# Patient Record
Sex: Male | Born: 1960 | Race: White | Hispanic: No | Marital: Married | Smoking: Never smoker
Health system: Southern US, Community
[De-identification: ages and names within clinical notes are randomized; demographics above are authoritative.]

## PROBLEM LIST (undated history)

## (undated) HISTORY — PX: HERNIA REPAIR: SHX51

## (undated) HISTORY — PX: NASAL SINUS SURGERY: SHX719

## (undated) HISTORY — PX: TRIGGER FINGER RELEASE: SHX641

---

## 2008-11-16 ENCOUNTER — Emergency Department: Payer: Self-pay | Admitting: Emergency Medicine

## 2009-01-11 ENCOUNTER — Ambulatory Visit: Payer: Self-pay | Admitting: Family Medicine

## 2009-12-20 ENCOUNTER — Ambulatory Visit: Payer: Self-pay | Admitting: Family Medicine

## 2010-03-22 ENCOUNTER — Ambulatory Visit: Payer: Self-pay | Admitting: Internal Medicine

## 2010-11-23 ENCOUNTER — Ambulatory Visit: Payer: Self-pay | Admitting: Unknown Physician Specialty

## 2010-11-24 ENCOUNTER — Other Ambulatory Visit: Payer: Self-pay

## 2010-11-30 ENCOUNTER — Ambulatory Visit: Payer: Self-pay | Admitting: Unknown Physician Specialty

## 2014-08-15 ENCOUNTER — Other Ambulatory Visit: Payer: Self-pay | Admitting: Family Medicine

## 2014-08-15 ENCOUNTER — Ambulatory Visit
Admission: RE | Admit: 2014-08-15 | Discharge: 2014-08-15 | Disposition: A | Discharge status: Home / Self Care | Payer: No Typology Code available for payment source | Source: Ambulatory Visit | Attending: Family Medicine | Admitting: Family Medicine

## 2014-08-15 DIAGNOSIS — K760 Fatty (change of) liver, not elsewhere classified: Secondary | ICD-10-CM | POA: Insufficient documentation

## 2014-08-15 DIAGNOSIS — K4090 Unilateral inguinal hernia, without obstruction or gangrene, not specified as recurrent: Secondary | ICD-10-CM | POA: Diagnosis not present

## 2014-08-15 DIAGNOSIS — D72829 Elevated white blood cell count, unspecified: Secondary | ICD-10-CM

## 2014-08-15 DIAGNOSIS — Z9889 Other specified postprocedural states: Secondary | ICD-10-CM | POA: Insufficient documentation

## 2014-08-15 DIAGNOSIS — R1032 Left lower quadrant pain: Secondary | ICD-10-CM

## 2014-08-15 DIAGNOSIS — Z8719 Personal history of other diseases of the digestive system: Secondary | ICD-10-CM

## 2014-08-15 DIAGNOSIS — K5732 Diverticulitis of large intestine without perforation or abscess without bleeding: Secondary | ICD-10-CM | POA: Diagnosis not present

## 2017-08-22 ENCOUNTER — Encounter: Payer: Self-pay | Admitting: Emergency Medicine

## 2017-08-22 ENCOUNTER — Emergency Department: Payer: 59

## 2017-08-22 ENCOUNTER — Emergency Department
Admission: EM | Admit: 2017-08-22 | Discharge: 2017-08-23 | Disposition: A | Discharge status: Home / Self Care | Payer: 59 | Attending: Emergency Medicine | Admitting: Emergency Medicine

## 2017-08-22 ENCOUNTER — Other Ambulatory Visit: Payer: Self-pay

## 2017-08-22 DIAGNOSIS — R030 Elevated blood-pressure reading, without diagnosis of hypertension: Secondary | ICD-10-CM | POA: Diagnosis not present

## 2017-08-22 DIAGNOSIS — R0789 Other chest pain: Secondary | ICD-10-CM | POA: Diagnosis present

## 2017-08-22 DIAGNOSIS — R002 Palpitations: Secondary | ICD-10-CM | POA: Diagnosis not present

## 2017-08-22 LAB — BASIC METABOLIC PANEL
Anion gap: 7 (ref 5–15)
BUN: 20 mg/dL (ref 6–20)
CALCIUM: 9.4 mg/dL (ref 8.9–10.3)
CHLORIDE: 103 mmol/L (ref 98–111)
CO2: 28 mmol/L (ref 22–32)
CREATININE: 0.99 mg/dL (ref 0.61–1.24)
GFR calc Af Amer: >60 mL/min (ref >60)
GFR calc non Af Amer: >60 mL/min (ref >60)
Glucose, Bld: 114 mg/dL — ABNORMAL HIGH (ref 70–99)
Potassium: 3.7 mmol/L (ref 3.5–5.1)
SODIUM: 138 mmol/L (ref 135–145)

## 2017-08-22 LAB — CBC
HCT: 45.9 % (ref 40.0–52.0)
Hemoglobin: 15.8 g/dL (ref 13.0–18.0)
MCH: 29.1 pg (ref 26.0–34.0)
MCHC: 34.5 g/dL (ref 32.0–36.0)
MCV: 84.5 fL (ref 80.0–100.0)
PLATELETS: 193 10*3/uL (ref 150–440)
RBC: 5.43 MIL/uL (ref 4.40–5.90)
RDW: 14.3 % (ref 11.5–14.5)
WBC: 8.5 10*3/uL (ref 3.8–10.6)

## 2017-08-22 LAB — TROPONIN I: Troponin I: <0.03 ng/mL (ref <0.03)

## 2017-08-22 NOTE — ED Triage Notes (Signed)
Pt comes into the ED via POV c/o chest pain that started on Sunday and has progresses to palpitations and right shoulder blade pain.  Patient states he has had some vertigo but was contributing it to his allergies, but denies any N/V.  Patient has even and unlabored respirations at this time and in NAD.  Patient has no diaphoresis  At this time.

## 2017-08-22 NOTE — ED Provider Notes (Signed)
[facility] Emergency Department Provider Note  ____________________________________________   First MD Initiated Contact with Patient 08/22/17 2255     (approximate)  I have reviewed the triage vital signs and the nursing notes.   HISTORY  Chief Complaint Chest Pain    HPI Jared Jones is a 57 y.o. male who self presents to the emergency department with a 10-minute episode of atypical chest pain 5 days ago.  He was in the car driving to dinner when he felt substernal pressure-like chest pain nonradiating.  No nausea.  No shortness of breath.  No diaphoresis.  This self resolved after 10 minutes and has not returned.  He has had intermittent episodes of palpitations throughout the course of the week.  Is been under tremendous amount of stress at work.  He began taking a baby aspirin every day once the chest pain occurred.  He takes no medications at home.  No history of DVT or pulmonary embolism.  No leg swelling.  No hemoptysis.  No history of myocardial infarction.  The pain was not ripping or tearing did not go straight to his back.  He is in the emergency department tonight because he is going on vacation to [place] and would like to make sure that he is "healthy enough for the trip".  He has never been diagnosed with hypertension but says that at his last several primary care office appointments his blood pressure has been slightly up.  He does not smoke.  He does not drink caffeine.    History reviewed. No pertinent past medical history.  There are no active problems to display for this patient.   Past Surgical History:  Procedure Laterality Date  . HERNIA REPAIR     inguinal hernia  . NASAL SINUS SURGERY    . TRIGGER FINGER RELEASE      Prior to Admission medications   Medication Sig Start Date End Date Taking? Authorizing Provider  hydrochlorothiazide (HYDRODIURIL) 12.5 MG tablet Take 1 tablet (12.5 mg total) by mouth daily. 08/23/17    Merrily Brittle, MD    Allergies Codeine and Augmentin [amoxicillin-pot clavulanate]  No family history on file.  Social History Social History   Tobacco Use  . Smoking status: Never Smoker  . Smokeless tobacco: Never Used  Substance Use Topics  . Alcohol use: Yes  . Drug use: Not Currently    Review of Systems Constitutional: No fever/chills Eyes: No visual changes. ENT: No sore throat. Cardiovascular: Positive for chest pain. Respiratory: Denies shortness of breath. Gastrointestinal: No abdominal pain.  No nausea, no vomiting.  No diarrhea.  No constipation. Genitourinary: Negative for dysuria. Musculoskeletal: Negative for back pain. Skin: Negative for rash. Neurological: Negative for headaches, focal weakness or numbness.   ____________________________________________   PHYSICAL EXAM:  VITAL SIGNS: ED Triage Vitals  Enc Vitals Group     BP 08/22/17 2001 (!) 177/95     Pulse Rate 08/22/17 2001 69     Resp 08/22/17 2001 16     Temp 08/22/17 2001 98.5 F (36.9 C)     Temp Source 08/22/17 2001 Oral     SpO2 08/22/17 2001 99 %     Weight 08/22/17 1958 204 lb (92.5 kg)     Height 08/22/17 1958 5' 8.5" (1.74 m)     Head Circumference --      Peak Flow --      Pain Score 08/22/17 1958 1     Pain Loc --  Pain Edu? --      Excl. in GC? --     Constitutional: Alert and oriented x4 well-appearing nontoxic no diaphoresis speaks full clear sentences Eyes: PERRL EOMI. Head: Atraumatic. Nose: No congestion/rhinnorhea. Mouth/Throat: No trismus Neck: No stridor.   Cardiovascular: Normal rate, regular rhythm. Grossly normal heart sounds.  Good peripheral circulation. Respiratory: Normal respiratory effort.  No retractions. Lungs CTAB and moving good air Gastrointestinal: Soft nontender Musculoskeletal: No lower extremity edema legs are equal in size Neurologic:  Normal speech and language. No gross focal neurologic deficits are appreciated. Skin:  Skin is  warm, dry and intact. No rash noted. Psychiatric: Mood and affect are normal. Speech and behavior are normal.    ____________________________________________   DIFFERENTIAL includes but not limited to  Acute coronary syndrome, pulmonary embolism, atrial fibrillation, supraventricular tachycardia, premature ventricular contractions ____________________________________________   LABS (all labs ordered are listed, but only abnormal results are displayed)  Labs Reviewed  BASIC METABOLIC PANEL - Abnormal; Notable for the following components:      Result Value   Glucose, Bld 114 (*)    All other components within normal limits  CBC  TROPONIN I  TROPONIN I    Lab work reviewed by me with no signs of acute ischemia x2 __________________________________________  EKG  ED ECG REPORT I, Merrily BrittleNeil Roshanda Balazs, the attending physician, personally viewed and interpreted this ECG.  Date: 08/22/2017 EKG Time:  Rate: 67 Rhythm: normal sinus rhythm QRS Axis: normal Intervals: normal ST/T Wave abnormalities: normal Narrative Interpretation: no evidence of acute ischemia  ____________________________________________  RADIOLOGY  Chest x-ray reviewed by me with no acute disease ____________________________________________   PROCEDURES  Procedure(s) performed: no  Procedures  Critical Care performed: no  ____________________________________________   INITIAL IMPRESSION / ASSESSMENT AND PLAN / ED COURSE  Pertinent labs & imaging results that were available during my care of the patient were reviewed by me and considered in my medical decision making (see chart for details).   The patient arrives hemodynamically stable and well-appearing.  EKG is nonischemic and first troponin is negative.  To fully rule him out given his age and possible elevated blood pressure I do think he requires a second troponin.  Fortunately the patient's second troponin is negative.  I will prescribe him  low-dose hydrochlorothiazide to begin managing his elevated blood pressure and refer him back to primary care.  He is discharged home in improved condition verbalizes understanding and agreement with the plan.      ____________________________________________   FINAL CLINICAL IMPRESSION(S) / ED DIAGNOSES  Final diagnoses:  Atypical chest pain  Palpitations  Elevated blood pressure reading      NEW MEDICATIONS STARTED DURING THIS VISIT:  Discharge Medication List as of 08/23/2017 12:52 AM    START taking these medications   Details  hydrochlorothiazide (HYDRODIURIL) 12.5 MG tablet Take 1 tablet (12.5 mg total) by mouth daily., Starting Sat 08/23/2017, Print         Note:  This document was prepared using Dragon voice recognition software and may include unintentional dictation errors.     Merrily Brittleifenbark, Lorcan Shelp, MD 08/23/17 [phone]

## 2017-08-22 NOTE — ED Notes (Signed)
Patient reports intermittent palpitations beginning Sunday. Patient c/o left chest pain, radiating to left neck, right shoulder and back Sunday and again today. Patient reports diaphoresis and vertigo on Sunday

## 2017-08-23 LAB — TROPONIN I

## 2017-08-23 MED ORDER — HYDROCHLOROTHIAZIDE 12.5 MG PO TABS
12.5000 mg | ORAL_TABLET | Freq: Every day | ORAL | 0 refills | Status: AC
Start: 2017-08-23 — End: ?

## 2017-08-23 NOTE — ED Notes (Signed)
Reviewed discharge instructions, follow-up care, and prescriptions with patient. Patient verbalized understanding of all information reviewed. Patient stable, with no distress noted at this time.    

## 2017-08-23 NOTE — Discharge Instructions (Signed)
It was a pleasure to take care of you today, and thank you for coming to our emergency department.  If you have any questions or concerns before leaving please ask the nurse to grab me and I'm more than happy to go through your aftercare instructions again.  If you were prescribed any opioid pain medication today such as Norco, Vicodin, Percocet, morphine, hydrocodone, or oxycodone please make sure you do not drive when you are taking this medication as it can alter your ability to drive safely.  If you have any concerns once you are home that you are not improving or are in fact getting worse before you can make it to your follow-up appointment, please do not hesitate to call [phone] and come back for further evaluation.  Merrily BrittleNeil Victor Granados, MD  Results for orders placed or performed during the hospital encounter of 08/22/17  Basic metabolic panel  Result Value Ref Range   Sodium 138 135 - 145 mmol/L   Potassium 3.7 3.5 - 5.1 mmol/L   Chloride 103 98 - 111 mmol/L   CO2 28 22 - 32 mmol/L   Glucose, Bld 114 (H) 70 - 99 mg/dL   BUN 20 6 - 20 mg/dL   Creatinine, Ser 1.610.99 0.61 - 1.24 mg/dL   Calcium 9.4 8.9 - 09.610.3 mg/dL   GFR calc non Af Amer >60 >60 mL/min   GFR calc Af Amer >60 >60 mL/min   Anion gap 7 5 - 15  CBC  Result Value Ref Range   WBC 8.5 3.8 - 10.6 K/uL   RBC 5.43 4.40 - 5.90 MIL/uL   Hemoglobin 15.8 13.0 - 18.0 g/dL   HCT 04.545.9 40.940.0 - 81.152.0 %   MCV 84.5 80.0 - 100.0 fL   MCH 29.1 26.0 - 34.0 pg   MCHC 34.5 32.0 - 36.0 g/dL   RDW 91.414.3 78.211.5 - 95.614.5 %   Platelets 193 150 - 440 K/uL  Troponin I  Result Value Ref Range   Troponin I <0.03 <0.03 ng/mL  Troponin I  Result Value Ref Range   Troponin I <0.03 <0.03 ng/mL   Dg Chest 2 View  Result Date: 08/22/2017 CLINICAL DATA:  Chest pain since 08/18/2017. EXAM: CHEST - 2 VIEW COMPARISON:  PA and lateral chest 11/16/2008. FINDINGS: The lungs are clear. Heart size is normal. No pneumothorax or pleural effusion. No acute or focal bony  abnormality. Aortic atherosclerosis noted. IMPRESSION: No acute disease. Atherosclerosis. Electronically Signed   By: Drusilla Kannerhomas  Dalessio M.D.   On: 08/22/2017 20:25

## 2018-02-24 ENCOUNTER — Ambulatory Visit
Admission: RE | Admit: 2018-02-24 | Discharge: 2018-02-24 | Disposition: A | Discharge status: Home / Self Care | Payer: BLUE CROSS/BLUE SHIELD | Attending: Family Medicine | Admitting: Family Medicine

## 2018-02-24 ENCOUNTER — Ambulatory Visit
Admission: RE | Admit: 2018-02-24 | Discharge: 2018-02-24 | Disposition: A | Discharge status: Home / Self Care | Payer: BLUE CROSS/BLUE SHIELD | Source: Ambulatory Visit | Attending: Family Medicine | Admitting: Family Medicine

## 2018-02-24 ENCOUNTER — Other Ambulatory Visit: Payer: Self-pay | Admitting: Family Medicine

## 2018-02-24 DIAGNOSIS — R053 Chronic cough: Secondary | ICD-10-CM

## 2018-02-24 DIAGNOSIS — R05 Cough: Secondary | ICD-10-CM

## 2018-11-14 IMAGING — CR DG CHEST 2V
2 series · 2 of 2 positions shown · non-contrast
Comparison: PA and lateral chest 11/16/2008.

CLINICAL DATA: Chest pain since 08/18/2017.

EXAM:
CHEST - 2 VIEW

[chest pa]
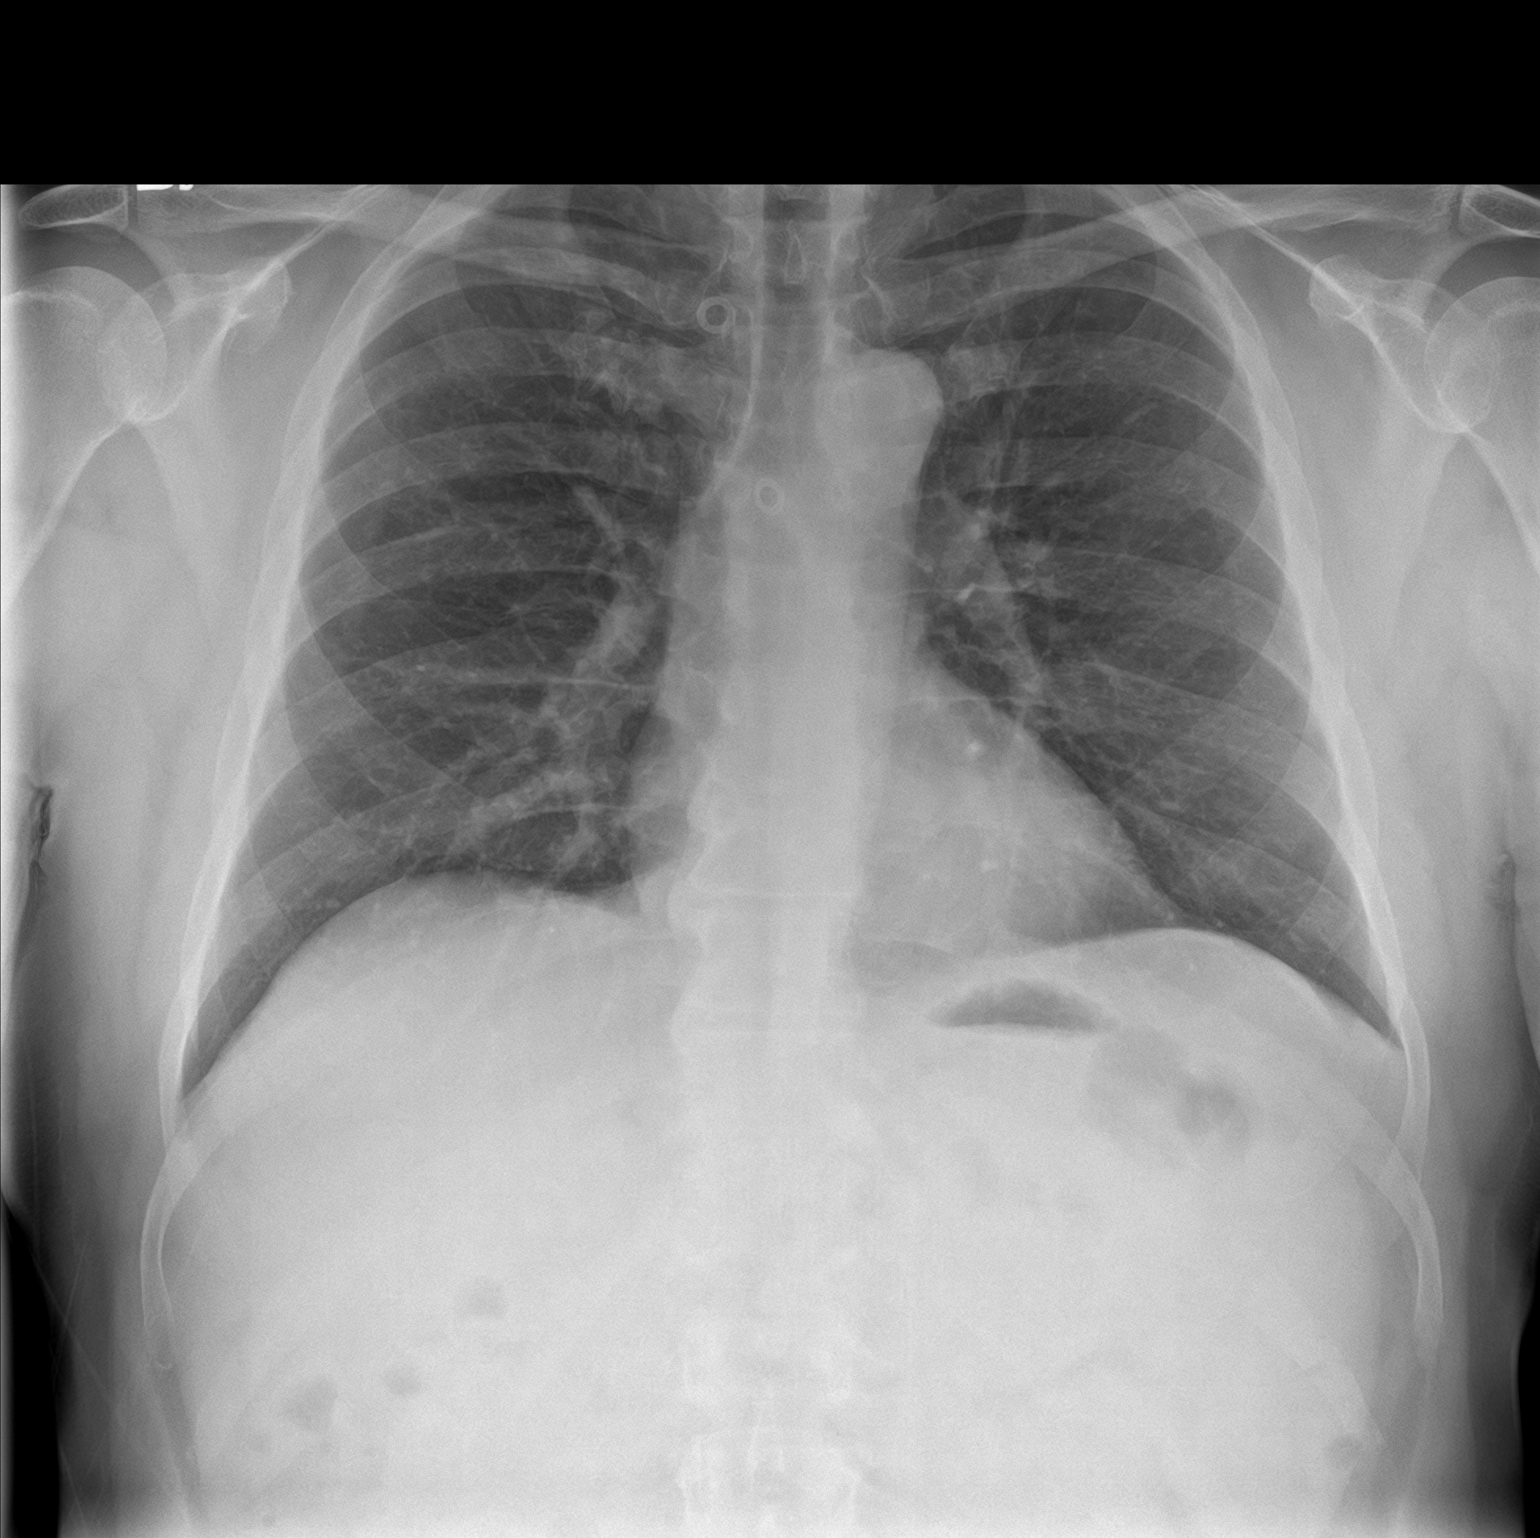

[chest lat]
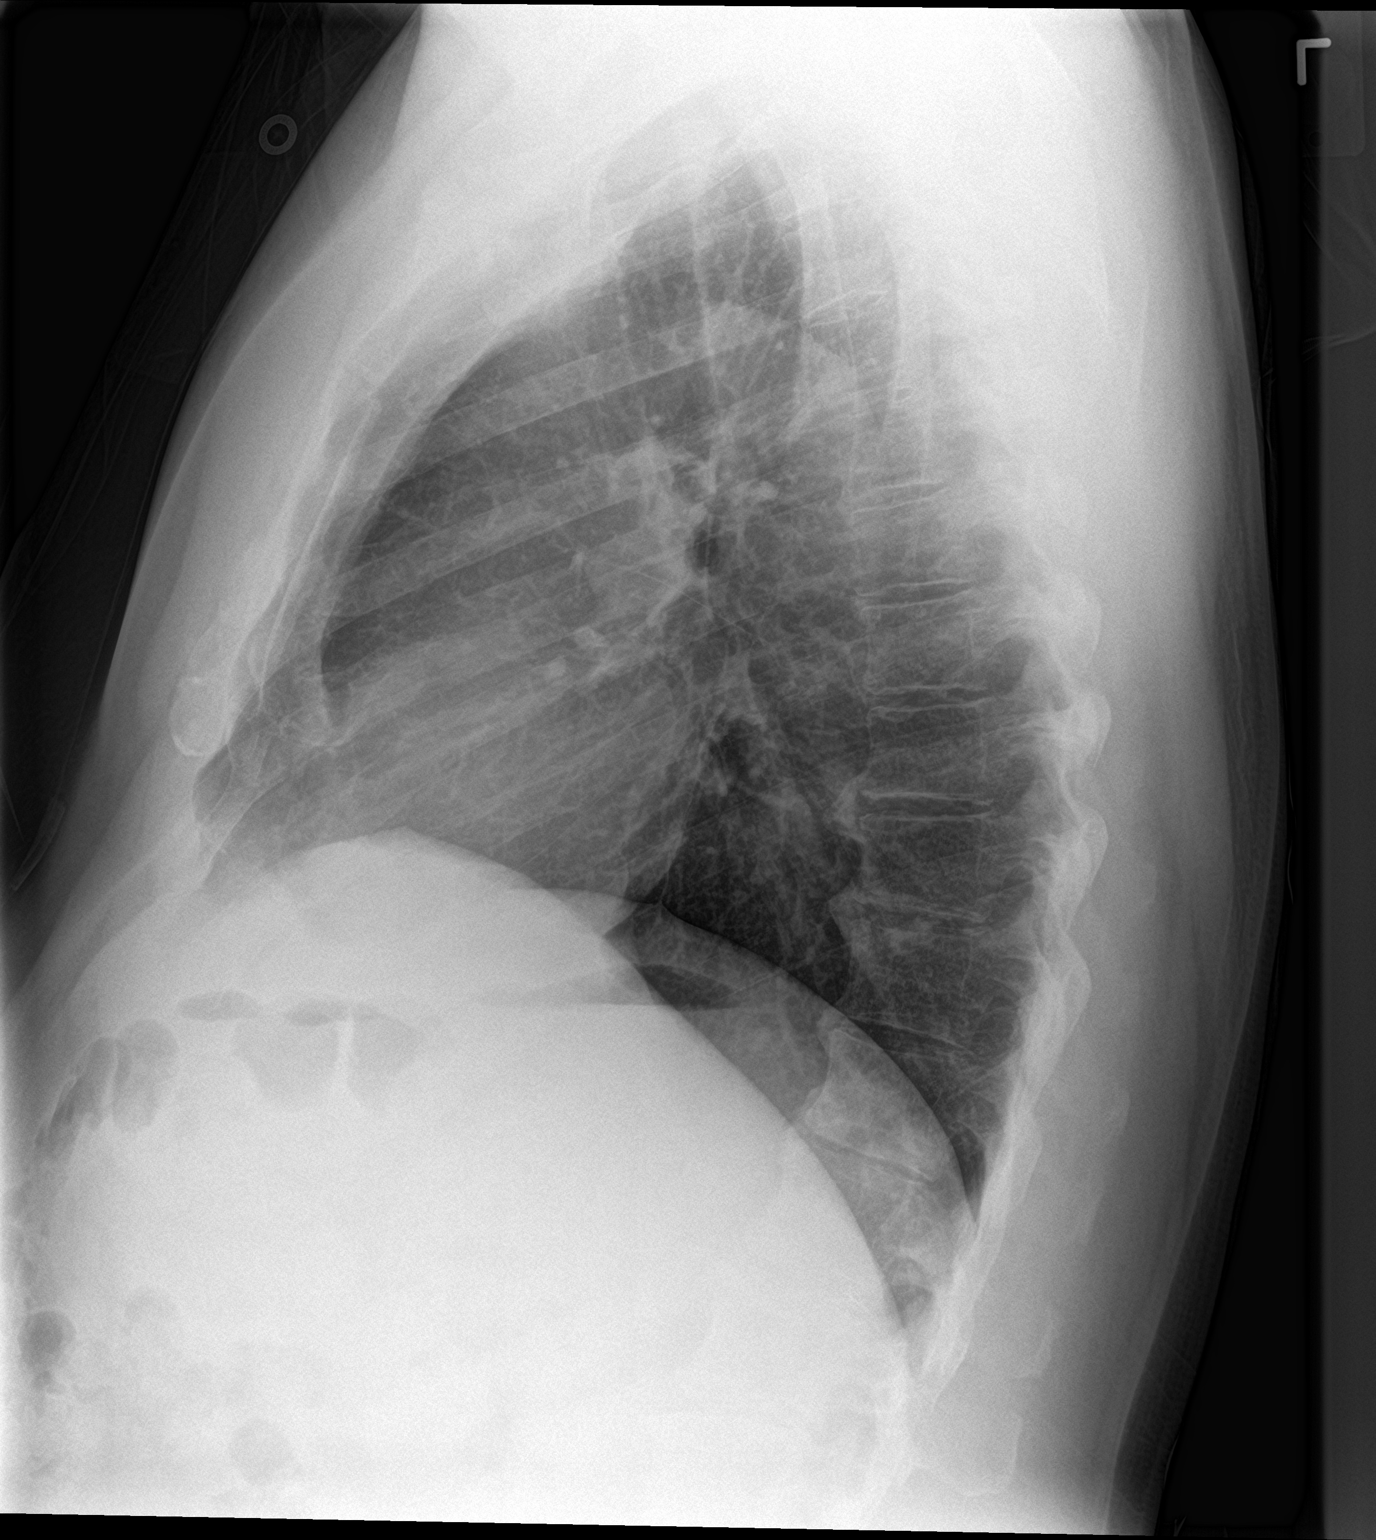

[2 of 2 positions shown; findings below may reference images not displayed]

FINDINGS: The lungs are clear. Heart size is normal. No pneumothorax or
pleural effusion. No acute or focal bony abnormality. Aortic
atherosclerosis noted.
IMPRESSION: No acute disease.

Atherosclerosis.

## 2019-05-19 IMAGING — CR DG CHEST 2V
2 series · 2 of 2 positions shown · non-contrast
Comparison: 08/22/2017

CLINICAL DATA: Productive cough.

EXAM:
CHEST - 2 VIEW

[chest pa]
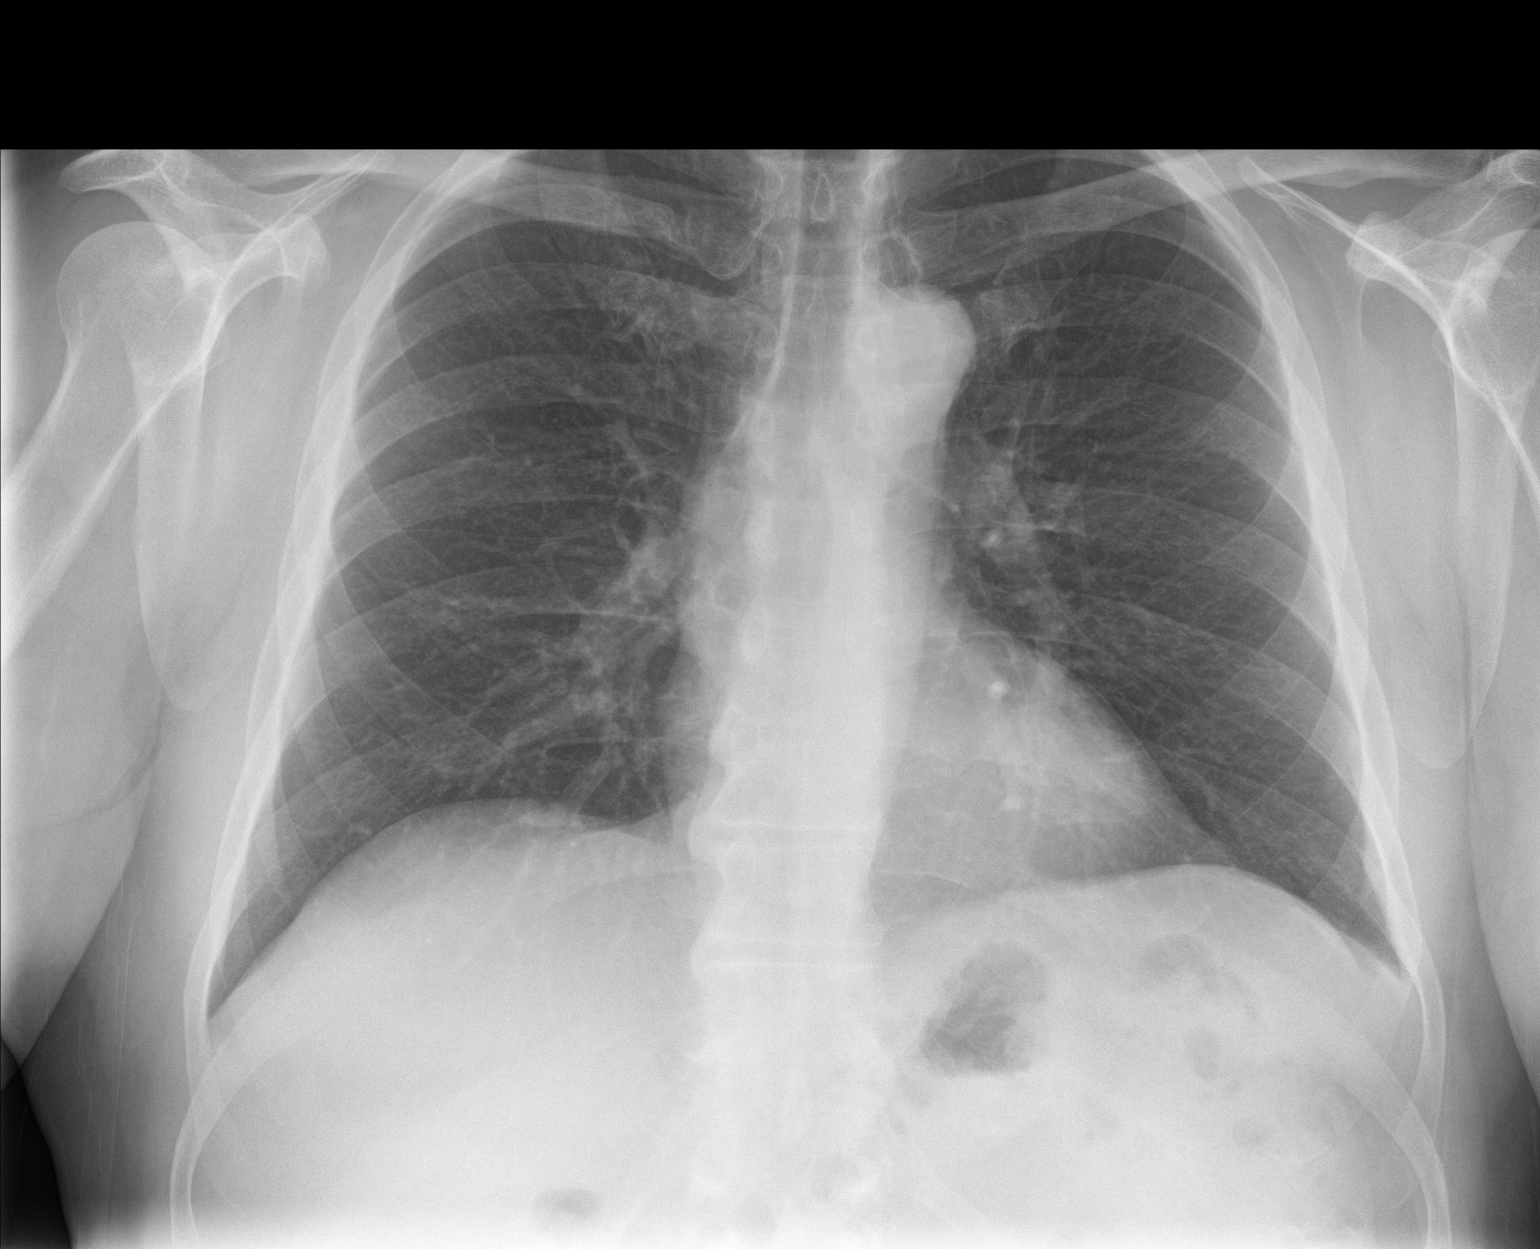

[chest lat]
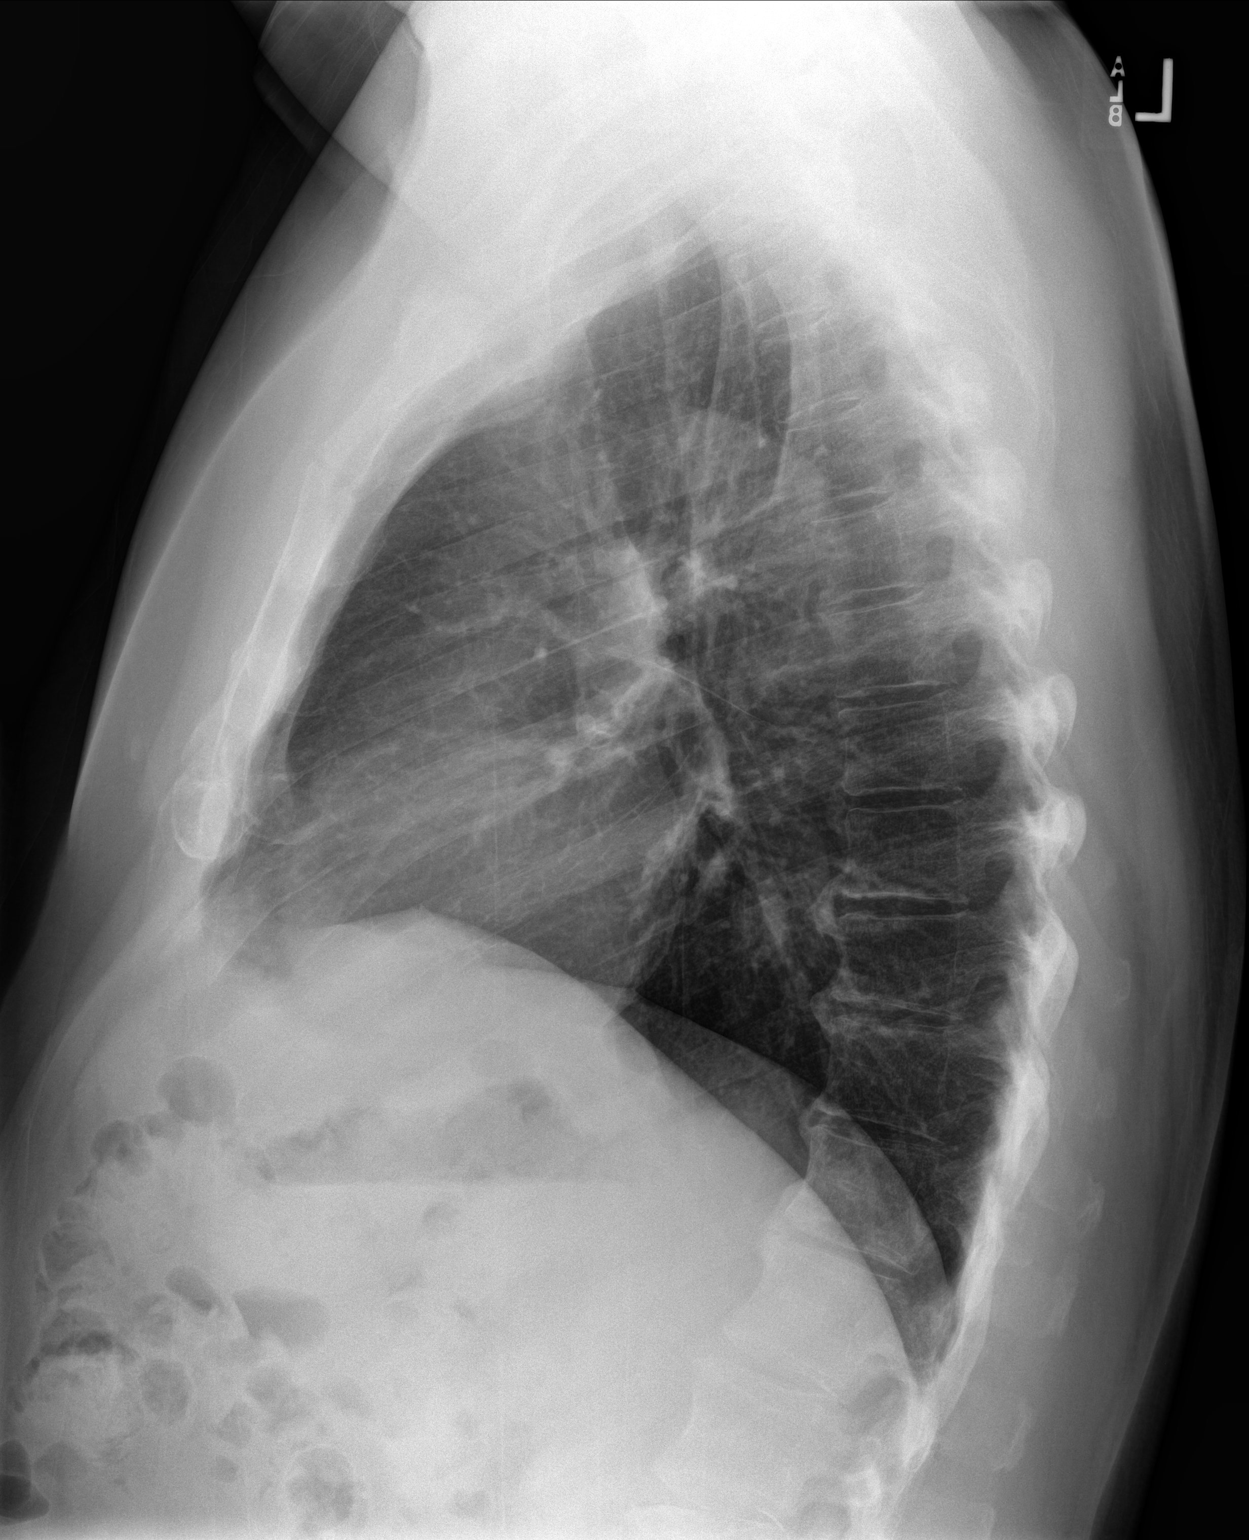

[2 of 2 positions shown; findings below may reference images not displayed]

FINDINGS: The lungs are clear without focal pneumonia, edema, pneumothorax or
pleural effusion. The cardiopericardial silhouette is within normal
limits for size. The visualized bony structures of the thorax are
intact.
IMPRESSION: No active cardiopulmonary disease.

## 2022-04-12 ENCOUNTER — Other Ambulatory Visit: Payer: Self-pay | Admitting: Student

## 2022-04-12 DIAGNOSIS — M5416 Radiculopathy, lumbar region: Secondary | ICD-10-CM

## 2022-04-24 ENCOUNTER — Ambulatory Visit
Admission: RE | Admit: 2022-04-24 | Discharge: 2022-04-24 | Disposition: A | Discharge status: Home / Self Care | Payer: No Typology Code available for payment source | Source: Ambulatory Visit | Attending: Student | Admitting: Student

## 2022-04-24 DIAGNOSIS — M5416 Radiculopathy, lumbar region: Secondary | ICD-10-CM
